# Patient Record
Sex: Male | Born: 1960 | Race: Black or African American | Hispanic: No | Marital: Single | Smoking: Current some day smoker
Health system: Southern US, Community
[De-identification: ages and names within clinical notes are randomized; demographics above are authoritative.]

---

## 2019-09-22 ENCOUNTER — Other Ambulatory Visit: Payer: Self-pay

## 2019-09-22 ENCOUNTER — Emergency Department (HOSPITAL_COMMUNITY)
Admission: EM | Admit: 2019-09-22 | Discharge: 2019-09-22 | Disposition: A | Discharge status: Home / Self Care | Payer: Self-pay | Attending: Emergency Medicine | Admitting: Emergency Medicine

## 2019-09-22 ENCOUNTER — Encounter (HOSPITAL_COMMUNITY): Payer: Self-pay | Admitting: Emergency Medicine

## 2019-09-22 ENCOUNTER — Emergency Department (HOSPITAL_COMMUNITY): Payer: Self-pay

## 2019-09-22 DIAGNOSIS — R079 Chest pain, unspecified: Secondary | ICD-10-CM | POA: Insufficient documentation

## 2019-09-22 DIAGNOSIS — M79602 Pain in left arm: Secondary | ICD-10-CM | POA: Insufficient documentation

## 2019-09-22 DIAGNOSIS — Z5321 Procedure and treatment not carried out due to patient leaving prior to being seen by health care provider: Secondary | ICD-10-CM | POA: Insufficient documentation

## 2019-09-22 LAB — CBC
HCT: 43.8 % (ref 39.0–52.0)
Hemoglobin: 13.9 g/dL (ref 13.0–17.0)
MCH: 30.8 pg (ref 26.0–34.0)
MCHC: 31.7 g/dL (ref 30.0–36.0)
MCV: 97.1 fL (ref 80.0–100.0)
Platelets: 341 10*3/uL (ref 150–400)
RBC: 4.51 MIL/uL (ref 4.22–5.81)
RDW: 15.8 % — ABNORMAL HIGH (ref 11.5–15.5)
WBC: 6.4 10*3/uL (ref 4.0–10.5)
nRBC: 0 % (ref 0.0–0.2)

## 2019-09-22 LAB — BASIC METABOLIC PANEL
Anion gap: 11 (ref 5–15)
BUN: 15 mg/dL (ref 6–20)
CO2: 24 mmol/L (ref 22–32)
Calcium: 8.7 mg/dL — ABNORMAL LOW (ref 8.9–10.3)
Chloride: 101 mmol/L (ref 98–111)
Creatinine, Ser: 1.1 mg/dL (ref 0.61–1.24)
GFR calc Af Amer: >60 mL/min (ref >60)
GFR calc non Af Amer: >60 mL/min (ref >60)
Glucose, Bld: 111 mg/dL — ABNORMAL HIGH (ref 70–99)
Potassium: 3.7 mmol/L (ref 3.5–5.1)
Sodium: 136 mmol/L (ref 135–145)

## 2019-09-22 LAB — TROPONIN I (HIGH SENSITIVITY): Troponin I (High Sensitivity): 11 ng/L (ref <18)

## 2019-09-22 MED ORDER — SODIUM CHLORIDE 0.9% FLUSH: 3.0000 mL | Freq: Once | INTRAVENOUS | Status: DC

## 2019-09-22 NOTE — ED Notes (Addendum)
Pt called X 3 to have vitals reassessed. No answer. Pt will be moved OTF. 

## 2019-09-22 NOTE — ED Notes (Signed)
No call last time, will move OTF

## 2019-09-22 NOTE — ED Triage Notes (Signed)
Pt presents to ED BIB [facility]. Pt c/o L CP that radiates to L arm. Pt reports that it has been intermittnet for days but worsens tonight. Pain is a 8/10. NAD. EMS gave 324mg  ASA, nitro x1 w/ no pain improvement

## 2019-09-24 ENCOUNTER — Other Ambulatory Visit: Payer: Self-pay

## 2019-09-24 ENCOUNTER — Encounter (HOSPITAL_COMMUNITY): Payer: Self-pay | Admitting: Emergency Medicine

## 2019-09-24 ENCOUNTER — Emergency Department (HOSPITAL_COMMUNITY)
Admission: EM | Admit: 2019-09-24 | Discharge: 2019-09-24 | Disposition: A | Discharge status: Home / Self Care | Payer: Self-pay | Attending: Emergency Medicine | Admitting: Emergency Medicine

## 2019-09-24 DIAGNOSIS — M79672 Pain in left foot: Secondary | ICD-10-CM | POA: Insufficient documentation

## 2019-09-24 DIAGNOSIS — Z5321 Procedure and treatment not carried out due to patient leaving prior to being seen by health care provider: Secondary | ICD-10-CM | POA: Insufficient documentation

## 2019-09-24 DIAGNOSIS — I10 Essential (primary) hypertension: Secondary | ICD-10-CM | POA: Insufficient documentation

## 2019-09-24 DIAGNOSIS — M79671 Pain in right foot: Secondary | ICD-10-CM | POA: Insufficient documentation

## 2019-09-24 NOTE — ED Triage Notes (Signed)
Pt c/o bilateral foot pain and is concerned he has blisters. Pt reports he walks a lot. Pt also c/o hypertension, no hx that he is aware of.

## 2019-09-24 NOTE — ED Notes (Signed)
Called pt x4 for vitals, no response. 

## 2020-10-01 ENCOUNTER — Encounter (HOSPITAL_COMMUNITY): Payer: Self-pay

## 2020-10-01 ENCOUNTER — Emergency Department (HOSPITAL_COMMUNITY)
Admission: EM | Admit: 2020-10-01 | Discharge: 2020-10-01 | Disposition: A | Discharge status: Home / Self Care | Payer: Self-pay | Attending: Emergency Medicine | Admitting: Emergency Medicine

## 2020-10-01 ENCOUNTER — Emergency Department (HOSPITAL_COMMUNITY): Payer: Self-pay

## 2020-10-01 DIAGNOSIS — Z5321 Procedure and treatment not carried out due to patient leaving prior to being seen by health care provider: Secondary | ICD-10-CM | POA: Insufficient documentation

## 2020-10-01 DIAGNOSIS — R2242 Localized swelling, mass and lump, left lower limb: Secondary | ICD-10-CM | POA: Insufficient documentation

## 2020-10-01 MED ORDER — ACETAMINOPHEN 325 MG PO TABS
650.0000 mg | ORAL_TABLET | Freq: Once | ORAL | Status: AC
  Administered 2020-10-01: 650 mg via ORAL
  Filled 2020-10-01: qty 2

## 2020-10-01 NOTE — ED Triage Notes (Signed)
Pt reports that he has been walking all day and now having some swelling and pain to the L knee, denies injury

## 2020-10-01 NOTE — ED Notes (Signed)
Pt called for vitals x3, no repsonse
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
## 2020-10-02 ENCOUNTER — Encounter (HOSPITAL_COMMUNITY): Payer: Self-pay | Admitting: Emergency Medicine

## 2020-10-02 ENCOUNTER — Other Ambulatory Visit: Payer: Self-pay

## 2020-10-02 ENCOUNTER — Emergency Department (HOSPITAL_COMMUNITY)
Admission: EM | Admit: 2020-10-02 | Discharge: 2020-10-02 | Disposition: A | Discharge status: Home / Self Care | Payer: Self-pay | Attending: Emergency Medicine | Admitting: Emergency Medicine

## 2020-10-02 DIAGNOSIS — Z5321 Procedure and treatment not carried out due to patient leaving prior to being seen by health care provider: Secondary | ICD-10-CM | POA: Insufficient documentation

## 2020-10-02 DIAGNOSIS — M79674 Pain in right toe(s): Secondary | ICD-10-CM | POA: Insufficient documentation

## 2020-10-02 NOTE — ED Triage Notes (Signed)
Patient reports painful corn/callus at right 5th toe onset this week .

## 2020-10-05 ENCOUNTER — Other Ambulatory Visit: Payer: Self-pay

## 2020-10-05 ENCOUNTER — Emergency Department (HOSPITAL_COMMUNITY)
Admission: EM | Admit: 2020-10-05 | Discharge: 2020-10-06 | Disposition: A | Discharge status: Home / Self Care | Payer: Self-pay | Attending: Emergency Medicine | Admitting: Emergency Medicine

## 2020-10-05 DIAGNOSIS — G44209 Tension-type headache, unspecified, not intractable: Secondary | ICD-10-CM

## 2020-10-05 DIAGNOSIS — I10 Essential (primary) hypertension: Secondary | ICD-10-CM | POA: Insufficient documentation

## 2020-10-05 DIAGNOSIS — Z79899 Other long term (current) drug therapy: Secondary | ICD-10-CM | POA: Insufficient documentation

## 2020-10-05 NOTE — ED Triage Notes (Signed)
Pt reports headache that started earlier today, states he hasn't taken his BP meds in 2 weeks due to financial issues.

## 2020-10-06 MED ORDER — AMLODIPINE BESYLATE 5 MG PO TABS
5.0000 mg | ORAL_TABLET | Freq: Every day | ORAL | 0 refills | Status: DC
Start: 2020-10-06 — End: 2020-10-14

## 2020-10-06 MED ORDER — AMLODIPINE BESYLATE 5 MG PO TABS
5.0000 mg | ORAL_TABLET | Freq: Once | ORAL | Status: AC
  Administered 2020-10-06: 5 mg via ORAL
  Filled 2020-10-06: qty 1

## 2020-10-06 NOTE — ED Provider Notes (Signed)
[facility] EMERGENCY DEPARTMENT Provider Note   CSN: [id number] Arrival date & time: 10/05/20  1621     History Chief Complaint  Patient presents with   Headache    Garrett Crawford is a 60 y.o. male.  HPI     This is a 60 year old male with a history of hypertension who presents with headache.  Patient reports bitemporal headache yesterday while he was outside.  He is currently homeless and living on the street.  He states that he does not often get headaches but he feels that this was related to the heat.  Once he was able to get out of the heat, his headache completely resolved.  He does have a history of high blood pressure but is not currently on his medications due to financial issues.  He reports that he is usually on Norvasc.  He states at 1 point today he did have some blurry vision when he had his headache but that has completely resolved.  No double vision.  No focal weakness or numbness.  No nausea or vomiting.  Denies fevers or neck stiffness.  Currently he is asymptomatic.  No past medical history on file.  There are no problems to display for this patient.   No past surgical history on file.     No family history on file.  Social History   Tobacco Use   Smoking status: Never  Substance Use Topics   Alcohol use: Never   Drug use: Never    Home Medications Prior to Admission medications   Medication Sig Start Date End Date Taking? Authorizing Provider  amLODipine (NORVASC) 5 MG tablet Take 1 tablet (5 mg total) by mouth daily. 10/06/20  Yes Naysa Puskas, Mayer Masker, MD    Allergies    Lisinopril  Review of Systems   Review of Systems  Constitutional:  Negative for fever.  Eyes:  Positive for visual disturbance.  Respiratory:  Negative for shortness of breath.   Cardiovascular:  Negative for chest pain.  Gastrointestinal:  Negative for abdominal pain, nausea and vomiting.  Neurological:  Positive for headaches. Negative for numbness.  All  other systems reviewed and are negative.  Physical Exam Updated Vital Signs BP (!) 183/97 (BP Location: Left Arm)   Pulse 73   Temp 98.5 F (36.9 C) (Oral)   Resp 18   SpO2 98%   Physical Exam Vitals and nursing note reviewed.  Constitutional:      Appearance: He is well-developed. He is not ill-appearing.  HENT:     Head: Normocephalic and atraumatic.  Eyes:     Pupils: Pupils are equal, round, and reactive to light.  Cardiovascular:     Rate and Rhythm: Normal rate and regular rhythm.     Heart sounds: Normal heart sounds. No murmur heard. Pulmonary:     Effort: Pulmonary effort is normal. No respiratory distress.     Breath sounds: Normal breath sounds. No wheezing.  Abdominal:     General: Bowel sounds are normal.     Palpations: Abdomen is soft.     Tenderness: There is no abdominal tenderness. There is no rebound.  Musculoskeletal:     Cervical back: Normal range of motion and neck supple.  Lymphadenopathy:     Cervical: No cervical adenopathy.  Skin:    General: Skin is warm and dry.  Neurological:     Mental Status: He is alert and oriented to person, place, and time.     Comments: Cranial nerves II  through XII intact, 5 out of 5 strength in all 4 extremities, no dysmetria to finger-nose-finger, visual fields intact  Psychiatric:        Mood and Affect: Mood normal.    ED Results / Procedures / Treatments   Labs (all labs ordered are listed, but only abnormal results are displayed) Labs Reviewed - No data to display  EKG None  Radiology No results found.  Procedures Procedures   Medications Ordered in ED Medications  amLODipine (NORVASC) tablet 5 mg (has no administration in time range)    ED Course  I have reviewed the triage vital signs and the nursing notes.  Pertinent labs & imaging results that were available during my care of the patient were reviewed by me and considered in my medical decision making (see chart for details).    MDM  Rules/Calculators/A&P                           Patient presents with a headache.  Symptoms have resolved.  Vital signs notable for blood pressure of 183/97.  He is currently asymptomatic.  Reports that his symptoms completely resolved after getting out of the heat.  He is currently homeless.  No red flags.  Neurologic exam is normal.  Doubt infectious etiology such as meningitis or subarachnoid hemorrhage.  Could have been related to some mild heat exhaustion and/or dehydration although patient clinically appears well at this time.  Reports that he was on Norvasc previously.  He is hypertensive but I have low suspicion for hypertensive urgency or emergency.  He was given his dose of Norvasc.  Will discharge with prescription for Norvasc.  Do not feel he needs work-up at this time as symptoms have completely resolved and he is neurologically intact without red flags.  After history, exam, and medical workup I feel the patient has been appropriately medically screened and is safe for discharge home. Pertinent diagnoses were discussed with the patient. Patient was given return precautions.  Final Clinical Impression(s) / ED Diagnoses Final diagnoses:  Acute non intractable tension-type headache  Hypertension, unspecified type    Rx / DC Orders ED Discharge Orders          Ordered    amLODipine (NORVASC) 5 MG tablet  Daily        10/06/20 0247             Shon Baton, MD 10/06/20 [phone]

## 2020-10-06 NOTE — Discharge Instructions (Addendum)
You were seen today for headache.  You will be given a prescription for your blood pressure medication.  Take as directed.

## 2020-10-06 NOTE — ED Notes (Signed)
Pt given a sandwich bag and water to drink. 

## 2020-10-13 ENCOUNTER — Encounter (HOSPITAL_COMMUNITY): Payer: Self-pay | Admitting: Student

## 2020-10-13 ENCOUNTER — Other Ambulatory Visit (HOSPITAL_COMMUNITY)
Admission: EM | Admit: 2020-10-13 | Discharge: 2020-10-14 | Disposition: A | Discharge status: Home / Self Care | Payer: No Payment, Other | Attending: Psychiatry | Admitting: Psychiatry

## 2020-10-13 ENCOUNTER — Other Ambulatory Visit: Payer: Self-pay

## 2020-10-13 DIAGNOSIS — Z9151 Personal history of suicidal behavior: Secondary | ICD-10-CM | POA: Insufficient documentation

## 2020-10-13 DIAGNOSIS — F1020 Alcohol dependence, uncomplicated: Secondary | ICD-10-CM | POA: Insufficient documentation

## 2020-10-13 DIAGNOSIS — Z7984 Long term (current) use of oral hypoglycemic drugs: Secondary | ICD-10-CM | POA: Insufficient documentation

## 2020-10-13 DIAGNOSIS — R45851 Suicidal ideations: Secondary | ICD-10-CM | POA: Diagnosis not present

## 2020-10-13 DIAGNOSIS — F17210 Nicotine dependence, cigarettes, uncomplicated: Secondary | ICD-10-CM | POA: Insufficient documentation

## 2020-10-13 DIAGNOSIS — Z5900 Homelessness unspecified: Secondary | ICD-10-CM | POA: Insufficient documentation

## 2020-10-13 DIAGNOSIS — F333 Major depressive disorder, recurrent, severe with psychotic symptoms: Secondary | ICD-10-CM | POA: Diagnosis not present

## 2020-10-13 DIAGNOSIS — F339 Major depressive disorder, recurrent, unspecified: Secondary | ICD-10-CM | POA: Diagnosis present

## 2020-10-13 DIAGNOSIS — Z20822 Contact with and (suspected) exposure to covid-19: Secondary | ICD-10-CM | POA: Diagnosis not present

## 2020-10-13 DIAGNOSIS — Z79899 Other long term (current) drug therapy: Secondary | ICD-10-CM | POA: Insufficient documentation

## 2020-10-13 DIAGNOSIS — E119 Type 2 diabetes mellitus without complications: Secondary | ICD-10-CM

## 2020-10-13 DIAGNOSIS — I10 Essential (primary) hypertension: Secondary | ICD-10-CM

## 2020-10-13 LAB — POCT URINE DRUG SCREEN - MANUAL ENTRY (I-SCREEN)
POC Amphetamine UR: NOT DETECTED
POC Buprenorphine (BUP): NOT DETECTED
POC Cocaine UR: NOT DETECTED
POC Marijuana UR: NOT DETECTED
POC Methadone UR: NOT DETECTED
POC Methamphetamine UR: NOT DETECTED
POC Morphine: NOT DETECTED
POC Oxazepam (BZO): NOT DETECTED
POC Oxycodone UR: NOT DETECTED
POC Secobarbital (BAR): NOT DETECTED

## 2020-10-13 LAB — LIPID PANEL
Cholesterol: 224 mg/dL — ABNORMAL HIGH (ref 0–200)
HDL: 43 mg/dL (ref >40)
LDL Cholesterol: 161 mg/dL — ABNORMAL HIGH (ref 0–99)
Total CHOL/HDL Ratio: 5.2 RATIO
Triglycerides: 101 mg/dL (ref <150)
VLDL: 20 mg/dL (ref 0–40)

## 2020-10-13 LAB — COMPREHENSIVE METABOLIC PANEL
ALT: 22 U/L (ref 0–44)
AST: 27 U/L (ref 15–41)
Albumin: 3.6 g/dL (ref 3.5–5.0)
Alkaline Phosphatase: 94 U/L (ref 38–126)
Anion gap: 10 (ref 5–15)
BUN: 12 mg/dL (ref 6–20)
CO2: 25 mmol/L (ref 22–32)
Calcium: 9.1 mg/dL (ref 8.9–10.3)
Chloride: 103 mmol/L (ref 98–111)
Creatinine, Ser: 0.94 mg/dL (ref 0.61–1.24)
GFR, Estimated: >60 mL/min (ref >60)
Glucose, Bld: 113 mg/dL — ABNORMAL HIGH (ref 70–99)
Potassium: 4 mmol/L (ref 3.5–5.1)
Sodium: 138 mmol/L (ref 135–145)
Total Bilirubin: 1.1 mg/dL (ref 0.3–1.2)
Total Protein: 6.8 g/dL (ref 6.5–8.1)

## 2020-10-13 LAB — HEMOGLOBIN A1C
Hgb A1c MFr Bld: 6.6 % — ABNORMAL HIGH (ref 4.8–5.6)
Mean Plasma Glucose: 142.72 mg/dL

## 2020-10-13 LAB — CBC WITH DIFFERENTIAL/PLATELET
Abs Immature Granulocytes: 0.02 10*3/uL (ref 0.00–0.07)
Basophils Absolute: 0.1 10*3/uL (ref 0.0–0.1)
Basophils Relative: 1 %
Eosinophils Absolute: 0.1 10*3/uL (ref 0.0–0.5)
Eosinophils Relative: 1 %
HCT: 50.2 % (ref 39.0–52.0)
Hemoglobin: 16.5 g/dL (ref 13.0–17.0)
Immature Granulocytes: 0 %
Lymphocytes Relative: 34 %
Lymphs Abs: 2.5 10*3/uL (ref 0.7–4.0)
MCH: 31 pg (ref 26.0–34.0)
MCHC: 32.9 g/dL (ref 30.0–36.0)
MCV: 94.2 fL (ref 80.0–100.0)
Monocytes Absolute: 1 10*3/uL (ref 0.1–1.0)
Monocytes Relative: 13 %
Neutro Abs: 3.7 10*3/uL (ref 1.7–7.7)
Neutrophils Relative %: 51 %
Platelets: 351 10*3/uL (ref 150–400)
RBC: 5.33 MIL/uL (ref 4.22–5.81)
RDW: 14.8 % (ref 11.5–15.5)
WBC: 7.4 10*3/uL (ref 4.0–10.5)
nRBC: 0 % (ref 0.0–0.2)

## 2020-10-13 LAB — RESP PANEL BY RT-PCR (FLU A&B, COVID) ARPGX2
Influenza A by PCR: NEGATIVE
Influenza B by PCR: NEGATIVE
SARS Coronavirus 2 by RT PCR: NEGATIVE

## 2020-10-13 LAB — TSH: TSH: 2.261 u[IU]/mL (ref 0.350–4.500)

## 2020-10-13 LAB — GLUCOSE, CAPILLARY
Glucose-Capillary: 110 mg/dL — ABNORMAL HIGH (ref 70–99)
Glucose-Capillary: 129 mg/dL — ABNORMAL HIGH (ref 70–99)
Glucose-Capillary: 132 mg/dL — ABNORMAL HIGH (ref 70–99)

## 2020-10-13 LAB — ETHANOL: Alcohol, Ethyl (B): <10 mg/dL (ref <10)

## 2020-10-13 MED ORDER — LORAZEPAM 1 MG PO TABS: 1.0000 mg | ORAL_TABLET | Freq: Four times a day (QID) | ORAL | Status: DC | PRN

## 2020-10-13 MED ORDER — AMLODIPINE BESYLATE 5 MG PO TABS
5.0000 mg | ORAL_TABLET | Freq: Every day | ORAL | Status: DC
  Administered 2020-10-13 – 2020-10-14 (×2): 5 mg via ORAL
  Filled 2020-10-13 (×2): qty 1

## 2020-10-13 MED ORDER — HYDROXYZINE HCL 25 MG PO TABS
25.0000 mg | ORAL_TABLET | Freq: Three times a day (TID) | ORAL | Status: DC | PRN
  Administered 2020-10-14: 25 mg via ORAL
  Filled 2020-10-13: qty 1

## 2020-10-13 MED ORDER — CLONIDINE HCL 0.1 MG PO TABS
0.1000 mg | ORAL_TABLET | Freq: Once | ORAL | Status: AC
  Administered 2020-10-13: 0.1 mg via ORAL
  Filled 2020-10-13: qty 1

## 2020-10-13 MED ORDER — ALUM & MAG HYDROXIDE-SIMETH 200-200-20 MG/5ML PO SUSP: 30.0000 mL | ORAL | Status: DC | PRN

## 2020-10-13 MED ORDER — TRAZODONE HCL 50 MG PO TABS: 50.0000 mg | ORAL_TABLET | Freq: Every evening | ORAL | Status: DC | PRN

## 2020-10-13 MED ORDER — LOPERAMIDE HCL 2 MG PO CAPS: 2.0000 mg | ORAL_CAPSULE | ORAL | Status: DC | PRN

## 2020-10-13 MED ORDER — MAGNESIUM HYDROXIDE 400 MG/5ML PO SUSP: 30.0000 mL | Freq: Every day | ORAL | Status: DC | PRN

## 2020-10-13 MED ORDER — ONDANSETRON 4 MG PO TBDP: 4.0000 mg | ORAL_TABLET | Freq: Four times a day (QID) | ORAL | Status: DC | PRN

## 2020-10-13 MED ORDER — ACETAMINOPHEN 325 MG PO TABS
650.0000 mg | ORAL_TABLET | Freq: Four times a day (QID) | ORAL | Status: DC | PRN
  Administered 2020-10-14: 650 mg via ORAL
  Filled 2020-10-13: qty 2

## 2020-10-13 MED ORDER — ADULT MULTIVITAMIN W/MINERALS CH
1.0000 | ORAL_TABLET | Freq: Every day | ORAL | Status: DC
  Administered 2020-10-13 – 2020-10-14 (×2): 1 via ORAL
  Filled 2020-10-13 (×2): qty 1

## 2020-10-13 MED ORDER — METFORMIN HCL 500 MG PO TABS
500.0000 mg | ORAL_TABLET | Freq: Two times a day (BID) | ORAL | Status: DC
  Administered 2020-10-14: 500 mg via ORAL
  Filled 2020-10-13: qty 1

## 2020-10-13 MED ORDER — THIAMINE HCL 100 MG PO TABS
100.0000 mg | ORAL_TABLET | Freq: Every day | ORAL | Status: DC
Start: 2020-10-13 — End: 2020-10-14
  Administered 2020-10-13 – 2020-10-14 (×2): 100 mg via ORAL
  Filled 2020-10-13 (×2): qty 1

## 2020-10-13 MED ORDER — ESCITALOPRAM OXALATE 10 MG PO TABS
10.0000 mg | ORAL_TABLET | Freq: Every day | ORAL | Status: DC
  Administered 2020-10-13 – 2020-10-14 (×2): 10 mg via ORAL
  Filled 2020-10-13 (×2): qty 1

## 2020-10-13 MED ORDER — INSULIN ASPART 100 UNIT/ML IJ SOLN
0.0000 [IU] | Freq: Three times a day (TID) | INTRAMUSCULAR | Status: DC
Start: 2020-10-13 — End: 2020-10-13
  Administered 2020-10-13: 1 [IU] via SUBCUTANEOUS

## 2020-10-13 NOTE — ED Notes (Signed)
Pt eating dinner

## 2020-10-13 NOTE — ED Provider Notes (Signed)
Patient's most recent blood glucose values have not been highly elevated (132 mg/dL at 2542 on 09/03/2374, 283 mg/dL at 1517 on 08/14/735, 106 mg/dL at 2694 on 8/54/6270).  Patient has only received 1 unit of NovoLog sliding scale insulin today, which was at 1807 on 10/13/2020 and patient did not require any additional insulin earlier in the day on 10/13/2020.  Thus, due to patient's blood sugar not appearing to be very high, believe that patient's sliding scale insulin can be discontinued at this time and believe that patient can be initiated on metformin BID at this time for his diabetes/blood sugar control.  Discussed with the patient discontinuing insulin and starting the patient on metformin twice daily for his diabetes.  Patient educated on side effect profile of metformin and patient verbalized understanding of this education.  Patient agreed to discontinue sliding scale insulin and to initiate metformin.  Patient's order for sliding scale insulin discontinued.  Order placed to initiate metformin 500 mg twice daily with meals starting on the morning of 10/14/2020.  We will continue current modified diet, as well as CBG checks 4 times daily, before meals and at bedtime.  Patient to notify nursing staff if he develops any symptoms or if any issues arise.

## 2020-10-13 NOTE — Progress Notes (Signed)
Pt is asleep. Respirations are even and unlabored. No signs of acute distress noted.  Pt's safety is maintained. 
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
## 2020-10-13 NOTE — Clinical Social Work Psych Note (Signed)
LCSW Update   LCSW met with patient for introduction and to discuss potential discharge planning. Patient presented with a flat, depressed affect with a conguent mood. [place] was cooperative with answering questions and providing information.   [place]rett [place] shared with LCSW that he has struggled with homelessness for the last "few days". [place] reports that he has not been able to find a job due to having no where to live, no transportation, not having a current ID and his ETOH use. He shared with LCSW that he was recently released from prison and came to the [place] area for "better opportunities".  It was later identified by MD that the patient was released from prison 8 days ago and he came directly to [facility].   Garrett Crawford shared he has an uncle who lives in [place], [place], however he does not know any of his contact information.   Garrett Crawford reports he drinks ETOH heavily on a daily basis. He could not identify any specific amounts, however stated "a lot".   Garrett [place] requested for possible residential treatment. LCSW will assess for appropriate referrals. Garrett Crawford also expressed interest in shelter services, if he cannot be admitted to a residential substance abuse treatment facility.   LCSW provided Employment and Homeless resources to the patients.   The patient expressed understanding of processes and stated that he did not have any further questions or concerns at this time.    LCSW will continue to follow.     Radonna Ricker, MSW, LCSW Clinical Education officer, museum ([place]) [facility]
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
## 2020-10-13 NOTE — ED Notes (Signed)
Dash called for stat pickup   

## 2020-10-13 NOTE — ED Notes (Addendum)
Pt given snack. 

## 2020-10-13 NOTE — Progress Notes (Signed)
[place] remained present in the milieu at intervals throughout the day, attending the group therapy sessions and eating the schedule meals.

## 2020-10-13 NOTE — ED Notes (Signed)
Patient unable to give urine sample at shift change

## 2020-10-13 NOTE — Group Therapy Note (Signed)
Problem Solving & Overcoming Obstacles Tuesday - Problem Solving & Overcoming Obstacles   Date: 10/13/20  Type of Therapy/Therapeutic Modalities: Group, CBT, Motivational Interviewing, Solution-Focused   Participation Level: Minimal  Objective - In this group patients will be encouraged to explore what they see as problems/obstacles to their own wellness and recovery. They will be guided to discuss their thoughts, feelings, and behaviors related to these obstacles. The group will process together ways to cope with barriers, with attention given to specific choices patients can make. Each patient will be challenged to identify changes they are motivated to make to overcome their obstacles. This group will be process-oriented, with patients participating in exploration of their own experiences as well as giving and receiving support and challenge from other group members.  Therapeutic Goals:  1. Patient will identify personal and current obstacles as they relate to admission. 2. Patient will identify barriers that currently interfere with their wellness or overcoming obstacles.  3. Patient will identify feelings, thought process and behaviors related to these barriers. 4. Patient will identify two changes they are willing to make to overcome these obstacles:   Summary of Patient Progress Pt not very active in group session. Pt began to be interactive in session but as the conversation continued pt became uncomfortable.Pt appeared to be overwhelmed with speaking about the problems in his life. Pt left the room tearful.
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
## 2020-10-13 NOTE — Clinical Social Work Psych Note (Signed)
Self-Sabotaging Behaviors  Self-Sabotaging Behaviors & Self Awareness   Date: 10/13/20  Type of Therapy and Topic:   Participation Level: Minimal   Objective: In this group, patients will learn how to identify obstacles, self-sabotaging and enabling behaviors, as well as: what are they, why do we do them and what needs these behaviors meet. Discuss unhealthy relationships and how to have positive healthy boundaries with those that sabotage and enable. Explore aspects of self-sabotage and enabling in yourself and how to limit these self-destructive behaviors in everyday life.  Therapeutic Goals:  Patient will identify one obstacle that relates to self-sabotage and enabling behaviors Patient will identify one personal self-sabotaging or enabling behavior they did prior to admission Patient will state a plan to change the above identified behavior Patient will demonstrate ability to communicate their needs through discussion and/or role play.   Summary of Patient's Progress:   Vash attended the group session, however he did not contribute to the group's discussion. Jaelen was asleep on and off throughout the group session.

## 2020-10-13 NOTE — Progress Notes (Addendum)
Received Garrett Crawford from the OBS area at 0730 hrs. He was cooperative with the admission process, ate breakfast and returned went to his room.  He was compliant with his medications. Later he attended the morning group therapy session, spoke with the social worker and the provider.

## 2020-10-14 DIAGNOSIS — Z20822 Contact with and (suspected) exposure to covid-19: Secondary | ICD-10-CM | POA: Diagnosis not present

## 2020-10-14 DIAGNOSIS — F1020 Alcohol dependence, uncomplicated: Secondary | ICD-10-CM | POA: Diagnosis not present

## 2020-10-14 DIAGNOSIS — I10 Essential (primary) hypertension: Secondary | ICD-10-CM

## 2020-10-14 DIAGNOSIS — F333 Major depressive disorder, recurrent, severe with psychotic symptoms: Secondary | ICD-10-CM | POA: Diagnosis not present

## 2020-10-14 DIAGNOSIS — E119 Type 2 diabetes mellitus without complications: Secondary | ICD-10-CM

## 2020-10-14 DIAGNOSIS — R45851 Suicidal ideations: Secondary | ICD-10-CM | POA: Diagnosis not present

## 2020-10-14 LAB — GLUCOSE, CAPILLARY: Glucose-Capillary: 120 mg/dL — ABNORMAL HIGH (ref 70–99)

## 2020-10-14 MED ORDER — METFORMIN HCL 500 MG PO TABS: 500.0000 mg | ORAL_TABLET | Freq: Two times a day (BID) | ORAL | 0 refills | Status: AC

## 2020-10-14 MED ORDER — ESCITALOPRAM OXALATE 10 MG PO TABS: 10.0000 mg | ORAL_TABLET | Freq: Every day | ORAL | 0 refills | Status: AC

## 2020-10-14 MED ORDER — AMLODIPINE BESYLATE 5 MG PO TABS: 5.0000 mg | ORAL_TABLET | Freq: Every day | ORAL | 0 refills | Status: AC

## 2020-10-14 MED ORDER — HYDROXYZINE HCL 25 MG PO TABS: 25.0000 mg | ORAL_TABLET | Freq: Three times a day (TID) | ORAL | 0 refills | Status: AC | PRN

## 2020-10-14 MED ORDER — TRAZODONE HCL 50 MG PO TABS: 50.0000 mg | ORAL_TABLET | Freq: Every evening | ORAL | 0 refills | Status: AC | PRN

## 2020-10-14 NOTE — ED Provider Notes (Signed)
[facility] Discharge Suicide Risk Assessment   Principal Problem: MDD (major depressive disorder), recurrent episode (HCC) Discharge Diagnoses: Principal Problem:   MDD (major depressive disorder), recurrent episode (HCC) Active Problems:   Essential hypertension   Diabetes (HCC)   Total Time spent with patient: 20 minutes  Musculoskeletal: Strength & Muscle Tone: within normal limits Gait & Station: normal Patient leans: N/A  Psychiatric Specialty Exam  Presentation  General Appearance: Appropriate for Environment; Casual  Eye Contact:Good  Speech:Clear and Coherent; Normal Rate  Speech Volume:Normal  Handedness: No data recorded  Mood and Affect  Mood:Euthymic (brighter than yesterday)  Duration of Depression Symptoms: Less than two weeks  Affect:Appropriate; Congruent   Thought Process  Thought Processes:Coherent; Goal Directed; Linear  Descriptions of Associations:Intact  Orientation:Full (Time, Place and Person)  Thought Content:WDL; Logical  History of Schizophrenia/Schizoaffective disorder:No  Duration of Psychotic Symptoms:Greater than six months  Hallucinations:Hallucinations: None   Ideas of Reference:None  Suicidal Thoughts:Suicidal Thoughts: No   Homicidal Thoughts:Homicidal Thoughts: No   Sensorium  Memory:Immediate Good; Recent Good; Remote Good  Judgment:Fair  Insight:Fair   Executive Functions  Concentration:Good  Attention Span:Good  Recall:Good  Fund of Knowledge:Good  Language:Good   Psychomotor Activity  Psychomotor Activity:Psychomotor Activity: Normal   Assets  Assets:Communication Skills; Desire for Improvement; Resilience   Sleep  Sleep:Sleep: Fair   Physical Exam: Physical Exam Constitutional:      Appearance: Normal appearance. He is normal weight.  HENT:     Head: Normocephalic and atraumatic.  Eyes:     Extraocular Movements: Extraocular movements intact.  Pulmonary:     Effort: Pulmonary  effort is normal.  Neurological:     General: No focal deficit present.     Mental Status: He is alert.   Review of Systems  Constitutional:  Negative for chills and fever.  HENT:  Negative for hearing loss.   Eyes:  Negative for discharge and redness.  Respiratory:  Negative for cough.   Cardiovascular:  Negative for chest pain.  Gastrointestinal:  Negative for abdominal pain.  Musculoskeletal:  Negative for myalgias.  Neurological:  Negative for headaches.  Psychiatric/Behavioral:  Positive for substance abuse. Negative for hallucinations and suicidal ideas. The patient is not nervous/anxious.   Blood pressure (!) 176/87, pulse 69, temperature 97.8 F (36.6 C), temperature source Oral, resp. rate 18, SpO2 100 %. There is no height or weight on file to calculate BMI.  Mental Status Per Nursing Assessment::   On Admission:   reported SI-is currently denying and requesting discharge  Demographic Factors:  Male, Low socioeconomic status, Living alone, and Unemployed  Loss Factors: Decrease in vocational status, Legal issues, and Financial problems/change in socioeconomic status  Historical Factors: Prior suicide attempts and Impulsivity  Risk Reduction Factors:   Positive coping skills or problem solving skills and future oriented  Continued Clinical Symptoms:  Depression:   Comorbid alcohol abuse/dependence Alcohol/Substance Abuse/Dependencies  Cognitive Features That Contribute To Risk:  None    Suicide Risk:  Minimal: No identifiable suicidal ideation.  Patients presenting with no risk factors but with morbid ruminations; may be classified as minimal risk based on the severity of the depressive symptoms   Follow-up Information     [facility] Follow up.   Specialty: Behavioral Health Why: If you remain in the [facility] area - Please go during walk-in hours to establish outpatient medication management, therapy and additional psychiatric  servivces. Walk-in hours are Monday-Thursday from 7:00am-11:00am. Be sure to have any discharge paperwork from this  encounter, including a list of medications., As needed Contact information: [address] [place] [address] [phone]        Services[facility] Follow up.   Why: If you remain in the [place] area - Please contact on a daily basis to inquire about bed availability for residential substance abuse services. A referral has been sent on your behalf. Contact information: [address] [place] [address] [phone]         [facility] Follow up.   Why: Please go during walk-in hours to establish outpatient medication management, therapy and additional psychiatric servivces.   Walk-in hours are Monday-Friday from 8:30am-until. To be seen, please try to arrive by 8:30am or as soon as possible. Patients are seen on a first come, first served basis.   Be sure to have any discharge paperwork from this encounter, including a list of medications. Please call main office for any additional questions or concerns. Contact information: [address]  [place], [place] [address]   Phone: [phone] Fax:[phone]                Plan Of Care/Follow-up recommendations:  Activity:  as tolerated Diet:  regular Other:       Patient is instructed prior to discharge to: Take all medications as prescribed by his/her mental healthcare provider. Report any adverse effects and or reactions from the medicines to his/her outpatient provider promptly. Patient has been instructed & cautioned: To not engage in alcohol and or illegal drug use while on prescription medicines. In the event of worsening symptoms, patient is instructed to call the crisis hotline, 911 and or go to the nearest ED for appropriate evaluation and treatment of symptoms. To follow-up with his/her primary care provider for your  other medical issues, concerns and or health care needs.  Patient is provided with scripts for 30 days of medications (see AVS for full list) as well as resources from SW for housing, substance abuse, homelessness, re-entry programs, and outpatient resources for medication management and therapy.     Estella Husk, MD 10/14/2020, 11:23 AM

## 2020-10-14 NOTE — ED Provider Notes (Signed)
FBC/OBS ASAP Discharge Summary  Date and Time: 10/14/2020 12:26 PM  Name: Garrett Crawford  MRN:  [id number]   Discharge Diagnoses:  Final diagnoses:  Severe episode of recurrent major depressive disorder, with psychotic features (HCC)  Alcohol use disorder, severe, dependence (HCC)  Suicidal ideation    Subjective:  Patient seen and chart reviewed. Pt is interviewed this AM in the cafe area. He is calm, cooperative and pleasant. He describes his mood as "good" ands reports sleeping and eating well. Pt verbalizes readiness for discharge-  "I am ready to go". Pt requests to be discharged to the [facility] rescue mission and states that he has stayed there prior to his 18 month incarceration.   Stay Summary: patient presented to the [facility] voluntarily on 8/16 for SI and alcohol use. Patient was recently incarcerated for 18 months and was released 8 days ago; he was incarcerated in [facility] and came to [place] as he heard there were job opportunities in the area. He reported drinking heavily for the last 8 days. He was admitted to the [facility] due to ongoing SI and was re-started on home medications as well as lexapro 10 mg and was started on metformin 500 mg BID as a1c was elevated at 6.6, which is c/w a diagnosis of diabetes-this was a new dx for him. He was placed on alcohol withdrawal precautions although did not meet threshold to receive  PRN ativan for withdrawal during stay.The following day, day of discharge, patient denied SI/HI/AVH and reported that he felt ready for discharged (See above) and requested to go to the [facility] rescue mission as he had been there before. Patient was provided with paper Rx for 30 days of medications and was provided with outpatient resources by SW for medication management, therapy, substance use, re-entry programs and housing.   Total Time spent with patient: 20 minutes  Past Psychiatric History: see H&P Past Medical History: No past medical history on file. No past surgical  history on file. Family History: No family history on file. Family Psychiatric History: see H&P Social History:  Social History   Substance and Sexual Activity  Alcohol Use Never     Social History   Substance and Sexual Activity  Drug Use Never    Social History   Socioeconomic History   Marital status: Single    Spouse name: Not on file   Number of children: Not on file   Years of education: Not on file   Highest education level: Not on file  Occupational History   Not on file  Tobacco Use   Smoking status: Some Days    Packs/day: 0.25    Types: Cigarettes    Passive exposure: Past (nicotine patch offered and refused pt states occassional smoker)   Smokeless tobacco: Never  Vaping Use   Vaping Use: Never used  Substance and Sexual Activity   Alcohol use: Never   Drug use: Never   Sexual activity: Not on file  Other Topics Concern   Not on file  Social History Narrative   Not on file   Social Determinants of Health   Financial Resource Strain: Not on file  Food Insecurity: Not on file  Transportation Needs: Not on file  Physical Activity: Not on file  Stress: Not on file  Social Connections: Not on file   SDOH:  SDOH Screenings   Alcohol Screen: Not on file  Depression (PHQ2-9): Medium Risk   PHQ-2 Score: 23  Financial Resource Strain: Not on file  Food  Insecurity: Not on file  Housing: Not on file  Physical Activity: Not on file  Social Connections: Not on file  Stress: Not on file  Tobacco Use: High Risk   Smoking Tobacco Use: Some Days   Smokeless Tobacco Use: Never  Transportation Needs: Not on file    Tobacco Cessation:  Prescription not provided because: /a  Current Medications:  Current Facility-Administered Medications  Medication Dose Route Frequency Provider Last Rate Last Admin   acetaminophen (TYLENOL) tablet 650 mg  650 mg Oral Q6H PRN Jaclyn Shaggy, PA-C   650 mg at 10/14/20 0751   alum & mag hydroxide-simeth (MAALOX/MYLANTA)  200-200-20 MG/5ML suspension 30 mL  30 mL Oral Q4H PRN Melbourne Abts W, PA-C       amLODipine (NORVASC) tablet 5 mg  5 mg Oral Daily Melbourne Abts W, PA-C   5 mg at 10/14/20 0905   escitalopram (LEXAPRO) tablet 10 mg  10 mg Oral Daily Melbourne Abts W, PA-C   10 mg at 10/14/20 2694   hydrOXYzine (ATARAX/VISTARIL) tablet 25 mg  25 mg Oral TID PRN Jaclyn Shaggy, PA-C   25 mg at 10/14/20 0751   loperamide (IMODIUM) capsule 2-4 mg  2-4 mg Oral PRN Melbourne Abts W, PA-C       LORazepam (ATIVAN) tablet 1 mg  1 mg Oral Q6H PRN Melbourne Abts W, PA-C       magnesium hydroxide (MILK OF MAGNESIA) suspension 30 mL  30 mL Oral Daily PRN Ladona Ridgel, Cody W, PA-C       metFORMIN (GLUCOPHAGE) tablet 500 mg  500 mg Oral BID WC Melbourne Abts W, PA-C   500 mg at 10/14/20 8546   multivitamin with minerals tablet 1 tablet  1 tablet Oral Daily Jaclyn Shaggy, PA-C   1 tablet at 10/14/20 0904   ondansetron (ZOFRAN-ODT) disintegrating tablet 4 mg  4 mg Oral Q6H PRN Melbourne Abts W, PA-C       thiamine tablet 100 mg  100 mg Oral Daily Melbourne Abts W, PA-C   100 mg at 10/14/20 2703   traZODone (DESYREL) tablet 50 mg  50 mg Oral QHS PRN Jaclyn Shaggy, PA-C       Current Outpatient Medications  Medication Sig Dispense Refill   amLODipine (NORVASC) 5 MG tablet Take 1 tablet (5 mg total) by mouth daily. 30 tablet 0   [START ON 10/15/2020] escitalopram (LEXAPRO) 10 MG tablet Take 1 tablet (10 mg total) by mouth daily. 30 tablet 0   hydrOXYzine (ATARAX/VISTARIL) 25 MG tablet Take 1 tablet (25 mg total) by mouth 3 (three) times daily as needed for anxiety. 30 tablet 0   metFORMIN (GLUCOPHAGE) 500 MG tablet Take 1 tablet (500 mg total) by mouth 2 (two) times daily with a meal. 60 tablet 0   traZODone (DESYREL) 50 MG tablet Take 1 tablet (50 mg total) by mouth at bedtime as needed for sleep. 30 tablet 0    PTA Medications: (Not in a hospital admission)   Musculoskeletal  Strength & Muscle Tone: within normal limits Gait & Station:  normal Patient leans: N/A  Psychiatric Specialty Exam  Presentation  General Appearance: Appropriate for Environment; Casual  Eye Contact:Good  Speech:Clear and Coherent; Normal Rate  Speech Volume:Normal  Handedness: No data recorded  Mood and Affect  Mood:Euthymic (brighter than yesterday)  Affect:Appropriate; Congruent   Thought Process  Thought Processes:Coherent; Goal Directed; Linear  Descriptions of Associations:Intact  Orientation:Full (Time, Place and Person)  Thought Content:WDL; Logical  Diagnosis  of Schizophrenia or Schizoaffective disorder in past: No   Hallucinations:Hallucinations: None   Ideas of Reference:None  Suicidal Thoughts:Suicidal Thoughts: No   Homicidal Thoughts:Homicidal Thoughts: No   Sensorium  Memory:Immediate Good; Recent Good; Remote Good  Judgment:Fair  Insight:Fair   Executive Functions  Concentration:Good  Attention Span:Good  Recall:Good  Fund of Knowledge:Good  Language:Good   Psychomotor Activity  Psychomotor Activity:Psychomotor Activity: Normal   Assets  Assets:Communication Skills; Desire for Improvement; Resilience   Sleep  Sleep:Sleep: Fair   Nutritional Assessment (For OBS and FBC admissions only) Has the patient had a weight loss or gain of 10 pounds or more in the last 3 months?: -- (Patient states that he is not sure if he has had any weight changes over the past 3 months.) Has the patient had a decrease in food intake/or appetite?: Yes Does the patient have dental problems?: No Does the patient have eating habits or behaviors that may be indicators of an eating disorder including binging or inducing vomiting?: No Has the patient recently lost weight without trying?: -- (Patient states that he is not sure if he has had any weight changes over the past 3 months.) Has the patient been eating poorly because of a decreased appetite?: Yes   Physical Exam  See SRA for physical Exam and  ROS Blood pressure (!) 176/87, pulse 69, temperature 97.8 F (36.6 C), temperature source Oral, resp. rate 18, SpO2 100 %. There is no height or weight on file to calculate BMI.  See SRA for suicide risk assessment  Disposition: self care. Patient being discharged to [facility] via safe transport. See SRA for additional details  Estella Husk, MD 10/14/2020, 12:26 PM

## 2020-10-14 NOTE — ED Notes (Signed)
Pt ambulatory, alert, and oriented X4 on and off the unit. Education, support, and encouragement provided. Discharge summary/AVS, prescriptions, medications, and follow up appointments reviewed with pt and given to pt. Suicide prevention resources also provided. Pt voiced his understanding of his discharge AVS. Pt's belongings in locker #31 returned and belongings sheet signed. Pt denies SI/HI, A/VH, pain, or any concerns at this time. Pt discharged to lobby to Safe Transport to be taken to [facility].

## 2020-10-14 NOTE — ED Notes (Signed)
Pt sleeping had no interaction with staff or peers required much encouragement to speak with provider. Pt was approached by Selena Batten the NP and refused to talk to him as if he was to sleepy to talk. Pt approached by this Clinical research associate and was able to awaken enough to answer question and agreed to take metiformen for his blood sugar starting in the AM.

## 2020-10-14 NOTE — ED Notes (Signed)
Pt c/o lower back pain at 8/10 and requests medication. Pt also c/o increased anxiety from "thinking about my next step." Pt given 650 mg Tylenol PRN, and 25 mg Vistaril PRN.

## 2020-10-14 NOTE — Discharge Instructions (Addendum)

## 2020-10-14 NOTE — Clinical Social Work Psych Note (Addendum)
CSW Discharge Note   CSW was informed by MD that the patient is now requesting to be discharged to the [facility]. CSW contacted [facility] to inquire about bed availability, according to their supervisor, they do have beds, however are not currently accepting any direct admits from ANY behavioral health facilities. CSW asked when did this policy come into effect, and the supervisor was unable to provide any information.  CSW asked if the patient was medically cleared and arrived on his own, would he still be screened for possible services? The supervisor hesitantly stated yes,then followed by stating that  if they "found out" the individual was from a behavioral health facility they will not accept him. CSW thanked the supervisor for the information.   CSW spoke with the patient about the conversation with [facility] and he stated "I know how to go about it. I have been there before, a lot of times before I went to prison. I still want yall to send me down there". CSW ensured the patient understood the risks of going through with his plan. The patient verbalized understanding and continued to request discharge.   CSW informed the patient's RN and MD.   Patient has been provided resources for Housing, Re-Entry programs, Substance Abuse and Homelessness.  Patient also has been provided outpatient resources for medication management and therapy services.  Patients will be transported via [employer] to the [facility].    CSW will continue to follow until discharge.    Garrett Crawford, MSW, LCSW Clinical Child psychotherapist (Facility Based Crisis) [facility]

## 2020-10-14 NOTE — ED Notes (Signed)
Remains asleep no distress noted easy rise and fall of chest witnessed pt remains asleep

## 2020-10-14 NOTE — Clinical Social Work Psych Note (Signed)
CSW Update    CSW sent updated information regarding the patient's medication change to [facility] for further review.    Patient was being reviewed by [facility] admissions, however due to a previous order of insulin sliding scale injections, the patients was no longer qualified for a screening appointment at [facility].   CSW reviewed patients chart and learned that the patient recently had a medication change. During re-evaluation by the provider, it was noticed that the patient's blood sugar levels were not as high as they were during ED/admission.   As a result, his insulin sliding scale was changed to Metformin 500mg  BID. Dr. also confirmed.   CSW will continue to follow and wait for a response from [facility] admissions.   [facility], MSW, LCSW Clinical Baldo Daub (Facility Based Crisis) [facility]

## 2020-10-14 NOTE — Clinical Social Work Psych Note (Signed)
CSW Update    CSW provided patient with various Re-Entry Programs in the area to assist with additional services he may need in regards to re-entering society.   Baldo Daub, MSW, LCSW Clinical Child psychotherapist (Facility Based Crisis) [facility]

## 2022-01-05 IMAGING — CR DG KNEE COMPLETE 4+V*L*
4 series · 4 of 4 positions shown · non-contrast
Comparison: None.

CLINICAL DATA: Left knee pain and swelling

EXAM:
LEFT KNEE - COMPLETE 4+ VIEW

[knee ap]
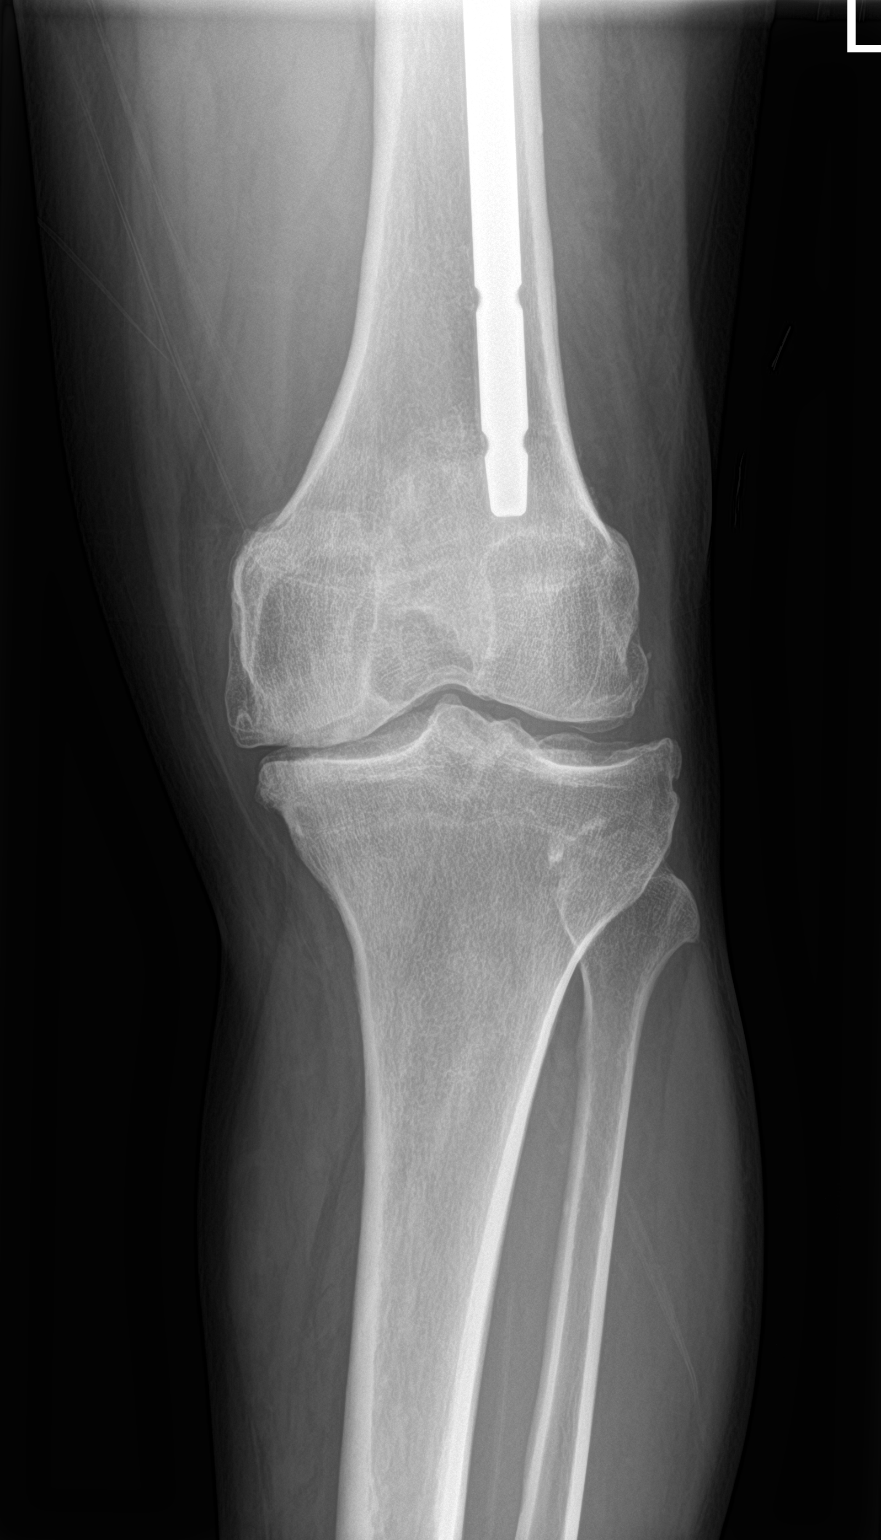

[knee obl (1 of 2)]
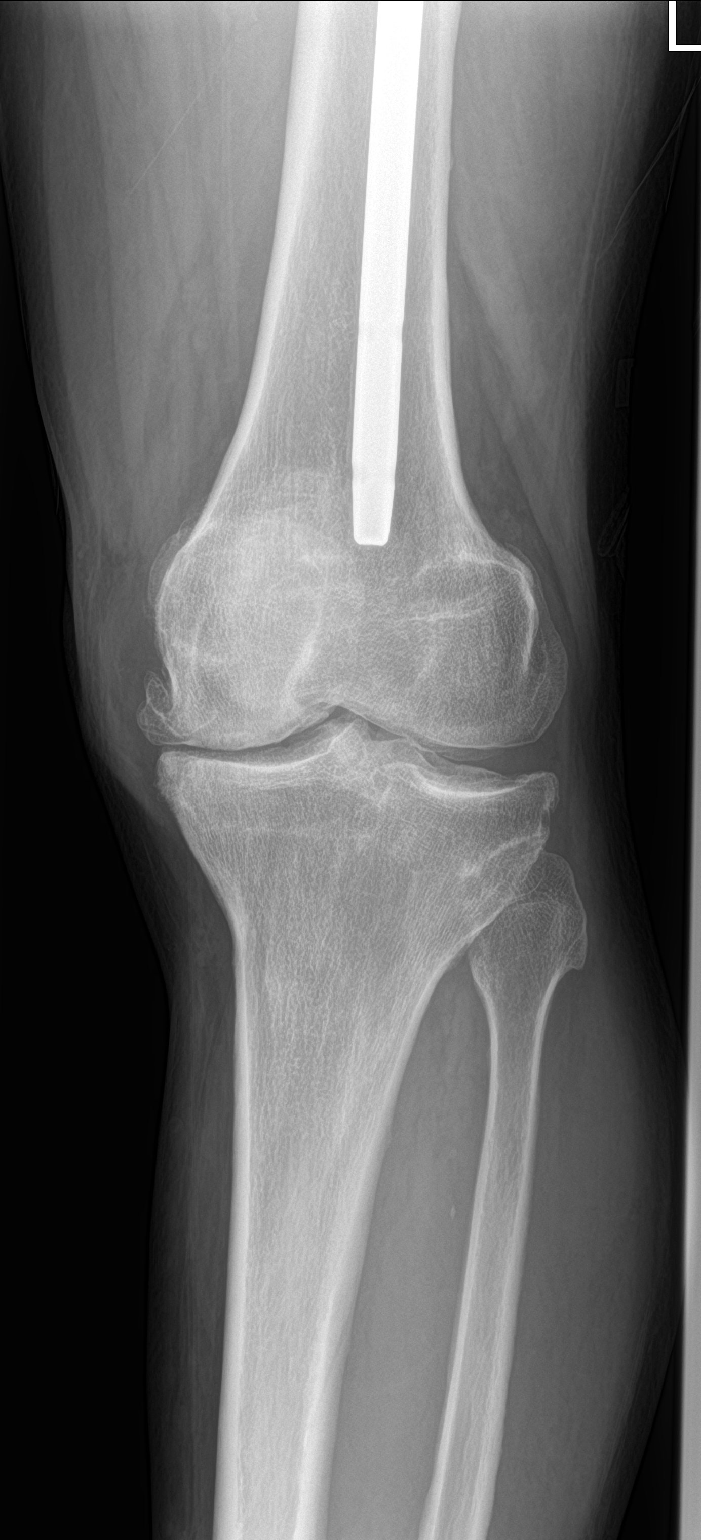

[knee obl (2 of 2)]
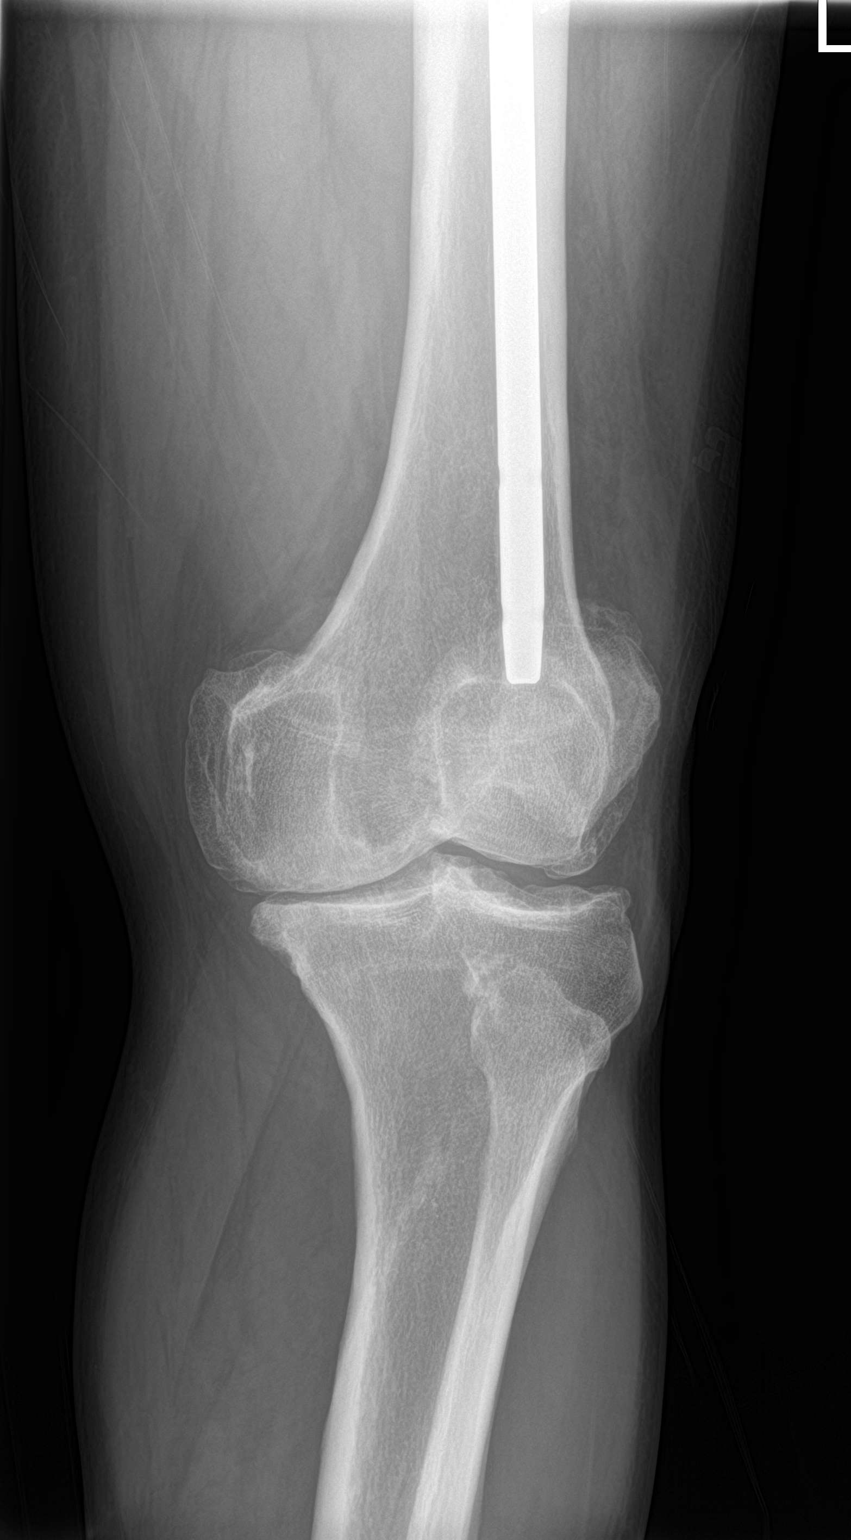

[knee lat]
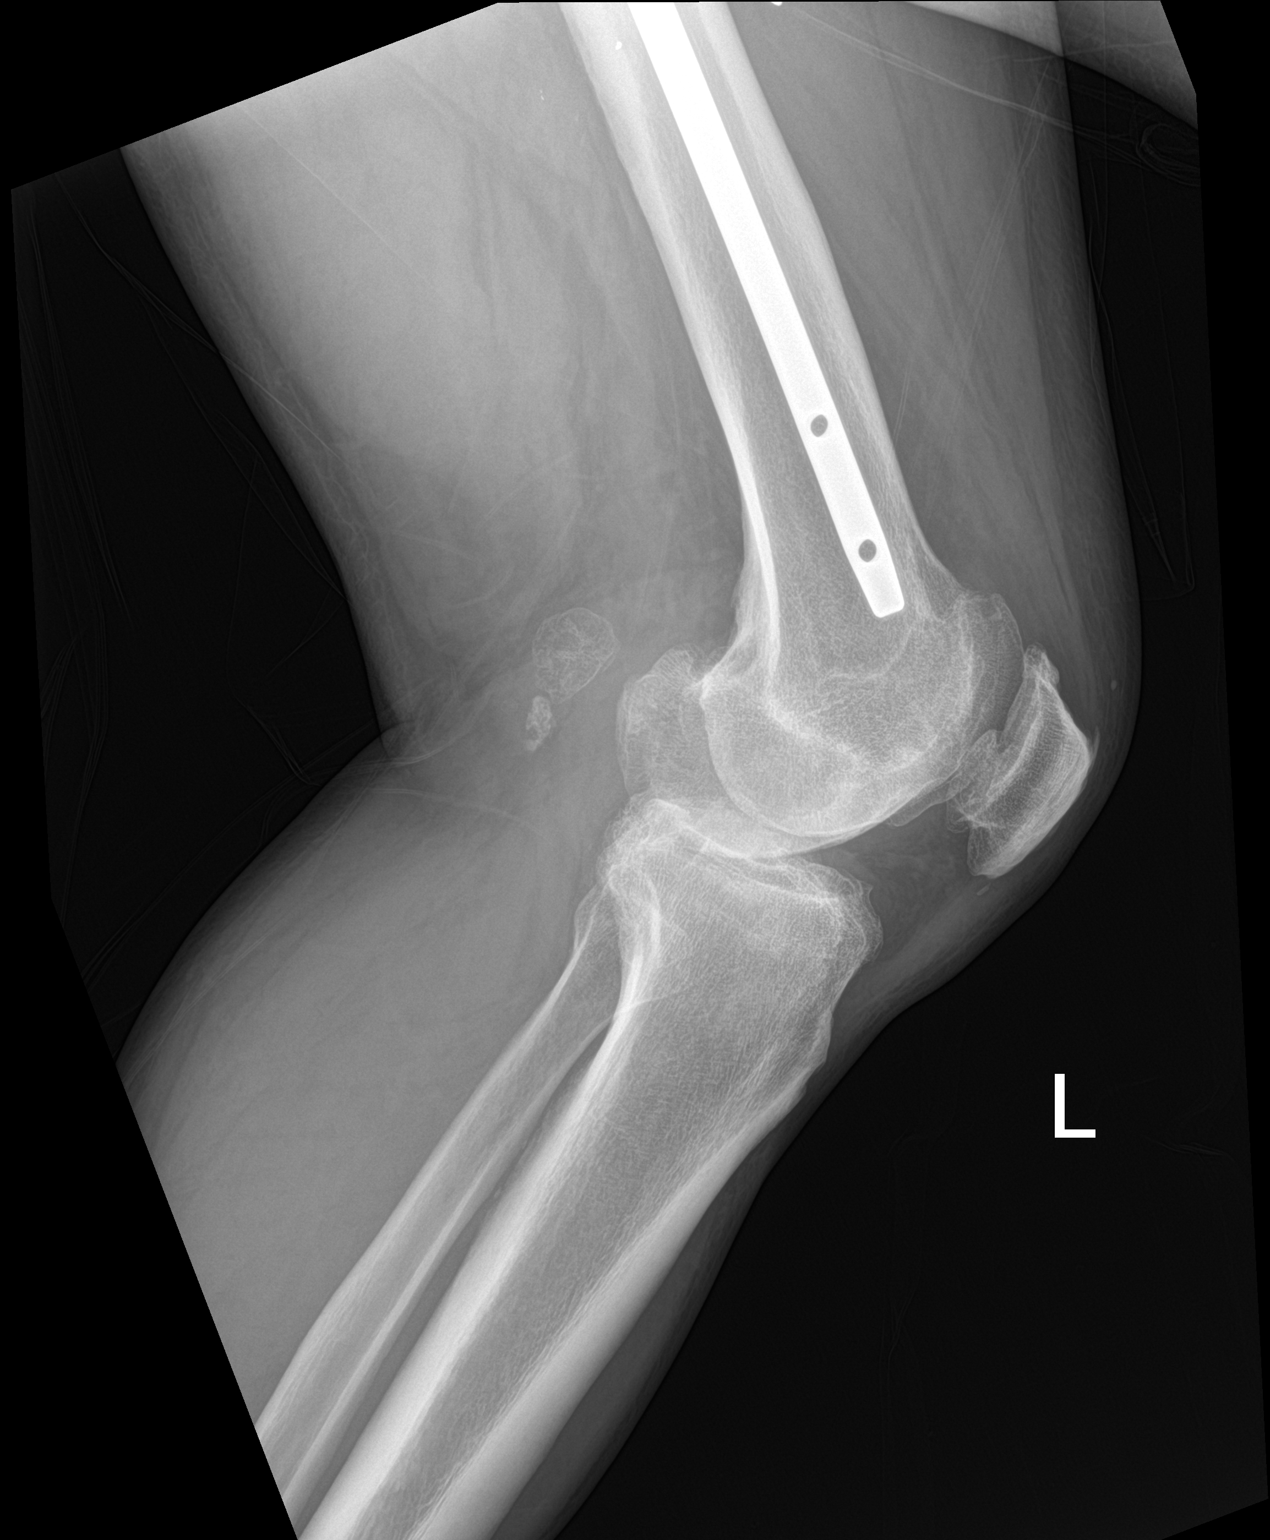

[4 of 4 positions shown; findings below may reference images not displayed]

FINDINGS: Femoral intramedullary rod is partially visualized. There is
moderate to severe tricompartmental degenerative arthritis of the
left knee with mild overall varus angulation. No acute fracture or
dislocation. No effusion. Soft tissues are unremarkable.
IMPRESSION: Moderate to severe tricompartmental degenerative arthritis.
